# Patient Record
Sex: Female | Born: 1965 | Race: Black or African American | Hispanic: No | Smoking: Former smoker
Health system: Southern US, Community
[De-identification: ages and names within clinical notes are randomized; demographics above are authoritative.]

## PROBLEM LIST (undated history)

## (undated) HISTORY — PX: TUBAL LIGATION: SHX77

---

## 2015-09-03 ENCOUNTER — Ambulatory Visit (INDEPENDENT_AMBULATORY_CARE_PROVIDER_SITE_OTHER): Payer: BLUE CROSS/BLUE SHIELD

## 2015-09-03 ENCOUNTER — Ambulatory Visit (INDEPENDENT_AMBULATORY_CARE_PROVIDER_SITE_OTHER): Payer: BLUE CROSS/BLUE SHIELD | Admitting: Family Medicine

## 2015-09-03 VITALS — BP 120/72 | HR 83 | Temp 98.1°F | Resp 18 | Ht 62.0 in | Wt 181.4 lb

## 2015-09-03 DIAGNOSIS — R202 Paresthesia of skin: Secondary | ICD-10-CM | POA: Diagnosis not present

## 2015-09-03 DIAGNOSIS — M654 Radial styloid tenosynovitis [de Quervain]: Secondary | ICD-10-CM | POA: Diagnosis not present

## 2015-09-03 DIAGNOSIS — M25531 Pain in right wrist: Secondary | ICD-10-CM

## 2015-09-03 DIAGNOSIS — Z8669 Personal history of other diseases of the nervous system and sense organs: Secondary | ICD-10-CM

## 2015-09-03 MED ORDER — HYDROCODONE-ACETAMINOPHEN 5-325 MG PO TABS: 1.0000 | ORAL_TABLET | Freq: Four times a day (QID) | ORAL | Status: DC | PRN

## 2015-09-03 MED ORDER — METHYLPREDNISOLONE 4 MG PO TBPK: ORAL_TABLET | ORAL | Status: DC

## 2015-09-03 MED ORDER — NAPROXEN 500 MG PO TABS: 500.0000 mg | ORAL_TABLET | Freq: Two times a day (BID) | ORAL | Status: DC

## 2015-09-03 MED ORDER — CYCLOBENZAPRINE HCL 5 MG PO TABS: 5.0000 mg | ORAL_TABLET | Freq: Three times a day (TID) | ORAL | Status: DC | PRN

## 2015-09-03 NOTE — Patient Instructions (Signed)
De Quervain Tenosynovitis °Tendons attach muscles to bones. They also help with joint movements. When tendons become irritated or swollen, it is called tendinitis. °The extensor pollicis brevis (EPB) tendon connects the EPB muscle to a bone that is near the base of the thumb. The EPB muscle helps to straighten and extend the thumb. De Quervain tenosynovitis is a condition in which the EPB tendon lining (sheath) becomes irritated, thickened, and swollen. This condition is sometimes called stenosing tenosynovitis. This condition causes pain on the thumb side of the back of the wrist. °CAUSES °Causes of this condition include: °· Activities that repeatedly cause your thumb and wrist to extend. °· A sudden increase in activity or change in activity that affects your wrist. °RISK FACTORS: °This condition is more likely to develop in: °· Females. °· People who have diabetes. °· Women who have recently given birth. °· People who are over 40 years of age. °· People who do activities that involve repeated hand and wrist motions, such as tennis, racquetball, volleyball, gardening, and taking care of children. °· People who do heavy labor. °· People who have poor wrist strength and flexibility. °· People who do not warm up properly before activities. °SYMPTOMS °Symptoms of this condition include: °· Pain or tenderness over the thumb side of the back of the wrist when your thumb and wrist are not moving. °· Pain that gets worse when you straighten your thumb or extend your thumb or wrist. °· Pain when the injured area is touched. °· Locking or catching of the thumb joint while you bend and straighten your thumb. °· Decreased thumb motion due to pain. °· Swelling over the affected area. °DIAGNOSIS °This condition is diagnosed with a medical history and physical exam. Your health care provider will ask for details about your injury and ask about your symptoms. °TREATMENT °Treatment may include the use of icing and medicines to  reduce pain and swelling. You may also be advised to wear a splint or brace to limit your thumb and wrist motion. In less severe cases, treatment may also include working with a physical therapist to strengthen your wrist and calm the irritation around your EPB tendon sheath. In severe cases, surgery may be needed. °HOME CARE INSTRUCTIONS °If You Have a Splint or Brace: °· Wear it as told by your health care provider. Remove it only as told by your health care provider. °· Loosen the splint or brace if your fingers become numb and tingle, or if they turn cold and blue. °· Keep the splint or brace clean and dry. °Managing Pain, Stiffness, and Swelling  °· If directed, apply ice to the injured area. °¨ Put ice in a plastic bag. °¨ Place a towel between your skin and the bag. °¨ Leave the ice on for 20 minutes, 2-3 times per day. °· Move your fingers often to avoid stiffness and to lessen swelling. °· Raise (elevate) the injured area above the level of your heart while you are sitting or lying down. °General Instructions °· Return to your normal activities as told by your health care provider. Ask your health care provider what activities are safe for you. °· Take over-the-counter and prescription medicines only as told by your health care provider. °· Keep all follow-up visits as told by your health care provider. This is important. °· Do not drive or operate heavy machinery while taking prescription pain medicine. °SEEK MEDICAL CARE IF: °· Your pain, tenderness, or swelling gets worse, even if you have had   treatment.  You have numbness or tingling in your wrist, hand, or fingers on the injured side.   This information is not intended to replace advice given to you by your health care provider. Make sure you discuss any questions you have with your health care provider.   Document Released: 08/14/2005 Document Revised: 05/05/2015 Document Reviewed: 10/20/2014 Elsevier Interactive Patient Education 2016  Elsevier Inc. Carpal Tunnel Syndrome Carpal tunnel syndrome is a condition that causes pain in your hand and arm. The carpal tunnel is a narrow area that is on the palm side of your wrist. Repeated wrist motion or certain diseases may cause swelling in the tunnel. This swelling can pinch the main nerve in the wrist (median nerve).  HOME CARE If You Have a Splint:  Wear it as told by your doctor. Remove it only as told by your doctor.  Loosen the splint if your fingers:  Become numb and tingle.  Turn blue and cold.  Keep the splint clean and dry. General Instructions  Take over-the-counter and prescription medicines only as told by your doctor.  Rest your wrist from any activity that may be causing your pain. If needed, talk to your employer about changes that can be made in your work, such as getting a wrist pad to use while typing.  If directed, apply ice to the painful area:  Put ice in a plastic bag.  Place a towel between your skin and the bag.  Leave the ice on for 20 minutes, 2-3 times per day.  Keep all follow-up visits as told by your doctor. This is important.  Do any exercises as told by your doctor, physical therapist, or occupational therapist. GET HELP IF:  You have new symptoms.  Medicine does not help your pain.  Your symptoms get worse.   This information is not intended to replace advice given to you by your health care provider. Make sure you discuss any questions you have with your health care provider.   Document Released: 08/03/2011 Document Revised: 05/05/2015 Document Reviewed: 12/30/2014 Elsevier Interactive Patient Education [employer]2016 Elsevier Inc.
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
## 2015-09-08 DIAGNOSIS — Z8669 Personal history of other diseases of the nervous system and sense organs: Secondary | ICD-10-CM | POA: Insufficient documentation

## 2017-08-31 IMAGING — CR DG WRIST COMPLETE 3+V*R*
4 series · 4 of 4 positions shown · non-contrast
Comparison: None.

CLINICAL DATA: Radial side wrist pain over the last 2 weeks.

EXAM:
RIGHT WRIST - COMPLETE 3+ VIEW

[PA]
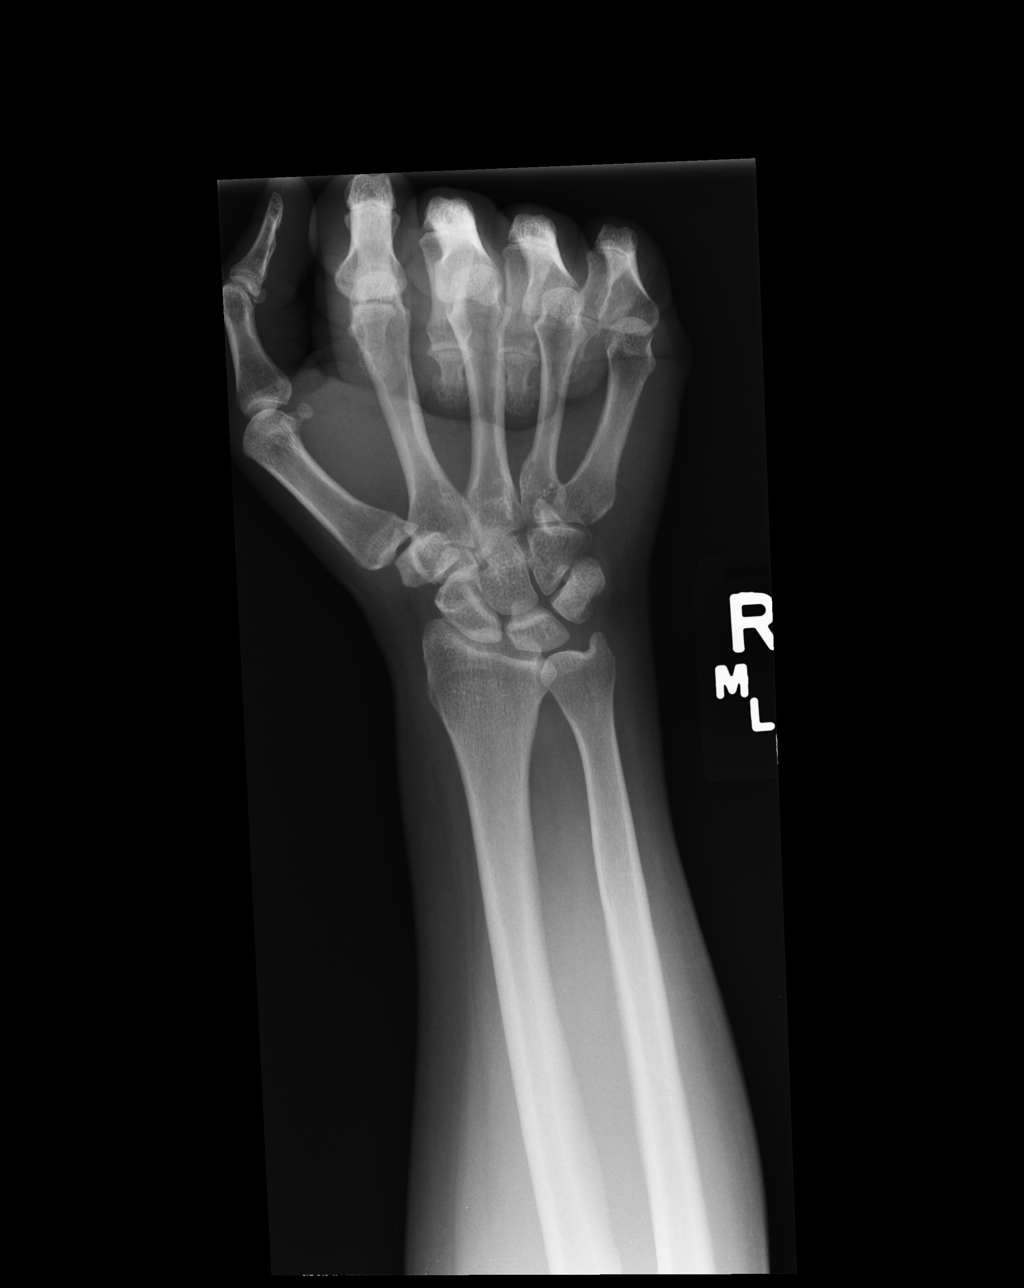

[pa int rot]
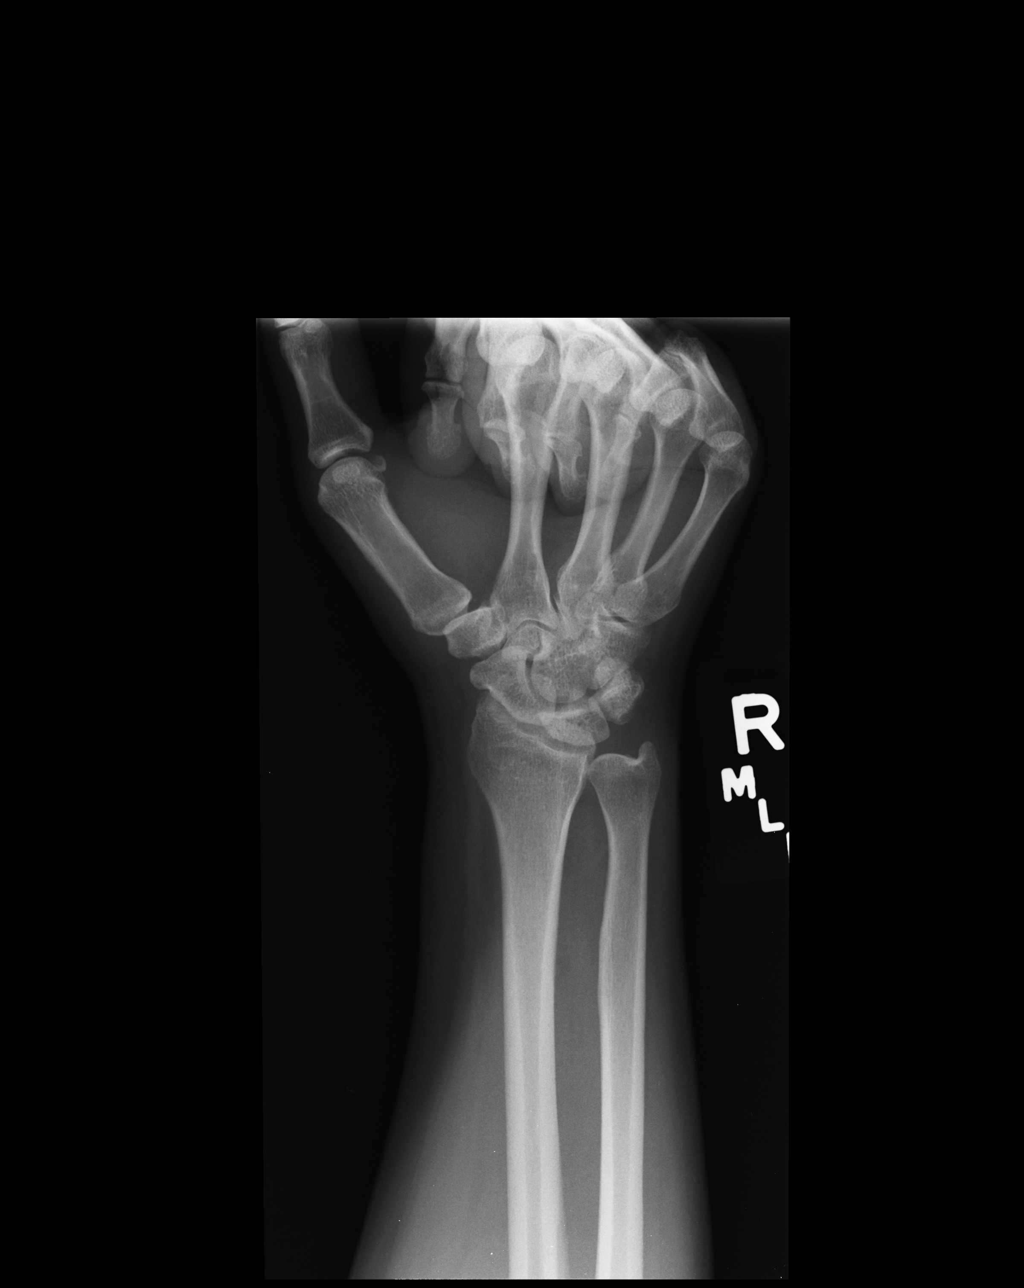

[lateral]
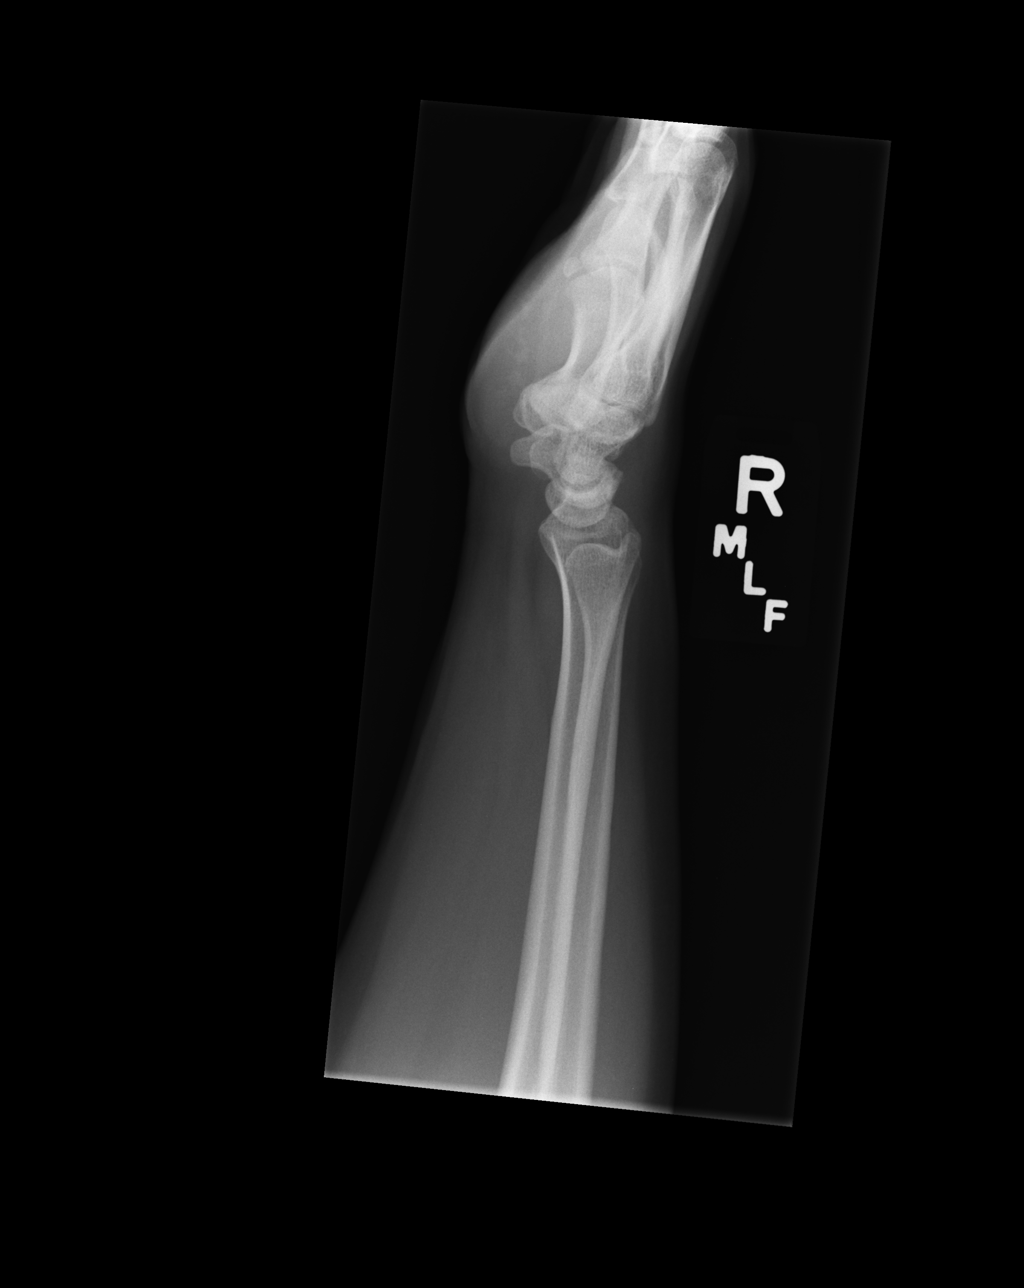

[pa navicular]
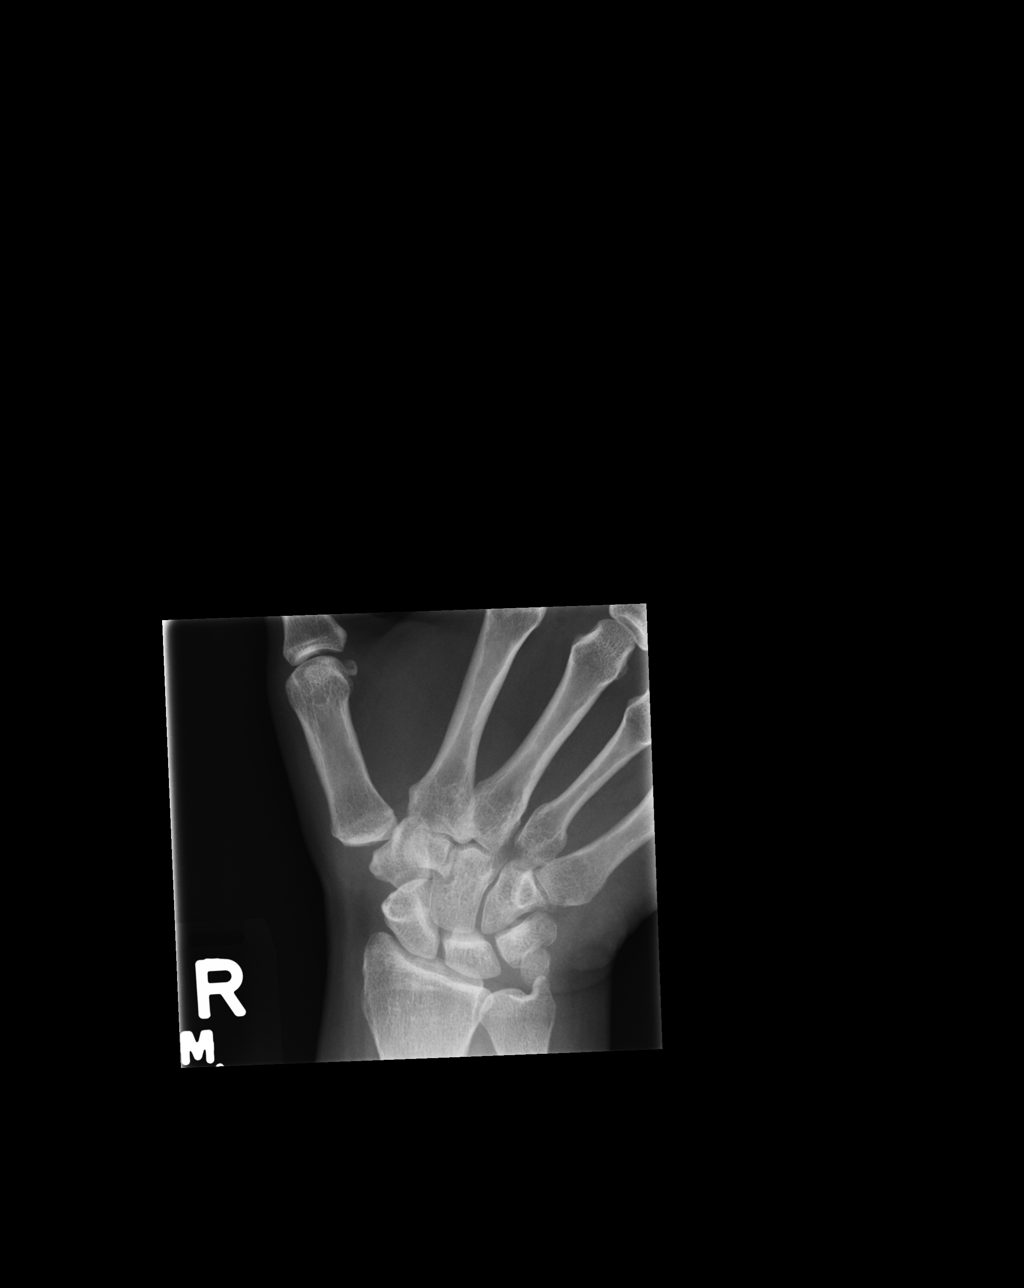

[4 of 4 positions shown; findings below may reference images not displayed]

FINDINGS: There is no evidence of fracture or dislocation. There is no
evidence of arthropathy or other focal bone abnormality. Soft
tissues are unremarkable.
IMPRESSION: Normal radiographs

## 2017-09-18 ENCOUNTER — Encounter: Payer: Self-pay | Admitting: Medical

## 2017-09-18 ENCOUNTER — Ambulatory Visit (INDEPENDENT_AMBULATORY_CARE_PROVIDER_SITE_OTHER): Payer: 59 | Admitting: Medical

## 2017-09-18 VITALS — BP 124/68 | HR 78 | Temp 98.6°F | Ht 61.5 in | Wt 175.6 lb

## 2017-09-18 DIAGNOSIS — J988 Other specified respiratory disorders: Secondary | ICD-10-CM | POA: Diagnosis not present

## 2017-09-18 DIAGNOSIS — H1033 Unspecified acute conjunctivitis, bilateral: Secondary | ICD-10-CM

## 2017-09-18 DIAGNOSIS — R05 Cough: Secondary | ICD-10-CM

## 2017-09-18 DIAGNOSIS — R059 Cough, unspecified: Secondary | ICD-10-CM

## 2017-09-18 MED ORDER — AZITHROMYCIN 500 MG PO TABS
ORAL_TABLET | ORAL | 0 refills | Status: DC
Start: 2017-09-18 — End: 2017-12-24

## 2017-09-18 MED ORDER — HYDROCODONE-HOMATROPINE 5-1.5 MG/5ML PO SYRP: 5.0000 mL | ORAL_SOLUTION | Freq: Three times a day (TID) | ORAL | 0 refills | Status: DC | PRN

## 2017-09-18 MED ORDER — POLYMYXIN B-TRIMETHOPRIM 10000-0.1 UNIT/ML-% OP SOLN: 2.0000 [drp] | Freq: Four times a day (QID) | OPHTHALMIC | 0 refills | Status: DC

## 2017-09-18 NOTE — Progress Notes (Signed)
Subjective:  Nazariah Schranz is a 52 y.o. female who presents as a new patient for cough, illness. Chief Complaint  Patient presents with  . New Patient (Initial Visit)    new pt, sore throat, headache, eye redness, dry coughing    She has been here in [place] for 3 years but no recent primary care anywhere.  She reports a one-week history of cough, somewhat productive cough, sore throat, headache, lingering cold that will not resolve.  However in the last 2 days has had red eyes, goopy eyes in the morning, crusting and worried about pinkeye.  Denies fever, nausea vomiting or diarrhea, no wheezing no shortness of breath no ear pain.  She is using over-the-counter cough medication.  She is leaving for a trip to the [place] in about a week, cannot afford to be sick as she has been planning this trip for 2 years.  No other aggravating or relieving factors.  No other c/o.  No past medical history on file.  ROS as in subjective   Objective: BP 124/68   Pulse 78   Temp 98.6 F (37 C)   Ht 5' 1.5" (1.562 m)   Wt 175 lb 9.6 oz (79.7 kg)   SpO2 99%   BMI 32.64 kg/m   General appearance: Alert, WD/WN, no distress                             Skin: warm, no rash                           Head: +frontal sinus tenderness,                            Eyes: conjunctiva normal, corneas clear, PERRLA                            Ears: pearly TMs, external ear canals normal                          Nose: septum midline, turbinates swollen, with erythema and mucoid discharge             Mouth/throat: MMM, tongue normal, mild pharyngeal erythema                           Neck: supple, no adenopathy, no thyromegaly, non tender                          Heart: RRR, normal S1, S2, no murmurs                         Lungs: CTA bilaterally, no wheezes, rales, or rhonchi       Assessment  Encounter Diagnoses  Name Primary?  . Acute conjunctivitis of both eyes, unspecified acute conjunctivitis type  Yes  . Cough   . Respiratory tract infection       Plan: We discussed her symptoms and exam findings.  Begin medication as below, rest, hydrate well, call or return if not improving or worse within the next 3-4 days.  Discussed diagnosis of conjunctivitis/pink eye.  Advised that pink eye is very contagious and spreads by direct contact.  Discussed treatment including moist  compresses, avoid rubbing eyes, do not wear contact lenses or makeup until infection is resolved.  Discussed prevention, hand washing, not rubbing eyes.    Gursimran was seen today for new patient (initial visit).  Diagnoses and all orders for this visit:  Acute conjunctivitis of both eyes, unspecified acute conjunctivitis type  Cough  Respiratory tract infection  Other orders -     HYDROcodone-homatropine (HYCODAN) 5-1.5 MG/5ML syrup; Take 5 mLs by mouth every 8 (eight) hours as needed for cough. -     azithromycin (ZITHROMAX) 500 MG tablet; 1 tablet po daily x 3 days -     trimethoprim-polymyxin b (POLYTRIM) ophthalmic solution; Place 2 drops into both eyes every 6 (six) hours.    Patient was advised to call or return if worse or not improving in the next few days.    Patient voiced understanding of diagnosis, recommendations, and treatment plan.

## 2017-12-24 ENCOUNTER — Encounter: Payer: Self-pay | Admitting: Medical

## 2017-12-24 ENCOUNTER — Ambulatory Visit (INDEPENDENT_AMBULATORY_CARE_PROVIDER_SITE_OTHER): Payer: 59 | Admitting: Medical

## 2017-12-24 VITALS — BP 128/66 | HR 85 | Temp 98.2°F | Ht 61.5 in | Wt 186.4 lb

## 2017-12-24 DIAGNOSIS — Z6834 Body mass index (BMI) 34.0-34.9, adult: Secondary | ICD-10-CM | POA: Insufficient documentation

## 2017-12-24 DIAGNOSIS — Z87891 Personal history of nicotine dependence: Secondary | ICD-10-CM | POA: Diagnosis not present

## 2017-12-24 DIAGNOSIS — R9431 Abnormal electrocardiogram [ECG] [EKG]: Secondary | ICD-10-CM | POA: Diagnosis not present

## 2017-12-24 DIAGNOSIS — Z136 Encounter for screening for cardiovascular disorders: Secondary | ICD-10-CM | POA: Insufficient documentation

## 2017-12-24 DIAGNOSIS — R635 Abnormal weight gain: Secondary | ICD-10-CM | POA: Diagnosis not present

## 2017-12-24 DIAGNOSIS — Z862 Personal history of diseases of the blood and blood-forming organs and certain disorders involving the immune mechanism: Secondary | ICD-10-CM

## 2017-12-24 NOTE — Progress Notes (Addendum)
Subjective: Chief Complaint  Patient presents with  . Referral    weight management    Here for weight concerns.  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  Seeing a nutritionist at her job for the past year.  Doing a 52 week program, has juicer, does online log in every week.   Had lost some weight with this.   Works at [employer].  No prior weight program.  Use to be a runner.   Is at a sit down job.   Has access to gym free at work.   Uses all natural fruit and kale smoothies.  Doesn't drink soda.  Exercise - a lot of walking on the treadmill.  No hx/o diabetes, HTN.   No other prior organized plan until this past year.  No prior medication for weight loss.  No prior surgery consult, not interested in surgery.  No prior concern for sleep apnea, had test for this in the past, maybe 2 years ago, was reportedly normal. Is requesting referral to [facility] weight loss clinic, Lenise Herald FNP for other strategies, possibly phentermine.  Does smoothie in the morning.   Has mid morning snack.  Eats a lot of fruit, sometimes crackers.  No fried food. States she is on 1200 cal restriction currently per nutritionist.     No past medical history on file.   Family History  Problem Relation Age of Onset  . Heart disease Father 68       CABG, valve disease, pacemaker    No current outpatient medications on file prior to visit.   No current facility-administered medications on file prior to visit.    ROS as in subjective    Objective: BP 128/66   Pulse 85   Temp 98.2 F (36.8 C)   Ht 5' 1.5" (1.562 m)   Wt 186 lb 6.4 oz (84.6 kg)   SpO2 99%   BMI 34.65 kg/m   Wt Readings from Last 3 Encounters:  12/24/17 186 lb 6.4 oz (84.6 kg)  09/18/17 175 lb 9.6 oz (79.7 kg)  09/03/15 181 lb 6.4 oz (82.3 kg)   General appearance: alert, no distress, WD/WN,  Neck: supple, no lymphadenopathy, no thyromegaly, no masses, no bruits Heart:  RRR, normal S1, S2, no murmurs Lungs: CTA bilaterally, no wheezes, rhonchi, or rales Pulses: 2+ symmetric, upper and lower extremities, normal cap refill Ext: no edema Psych: pleasant, good eye contact, answers questions appropriately   Adult ECG Report  Indication: heart disease screen, weight loss efforts  Rate: 79 bpm  Rhythm: normal sinus rhythm  QRS Axis: -61 degrees  PR Interval: 180 ms  QRS Duration:  QTc:  Conduction Disturbances: T wave inversions lateral leads, prolonged QT, incomplete RBBB  Other Abnormalities: none  Patient's cardiac risk factors are: obesity (BMI >= 30 kg/m2).  EKG comparison: none  Narrative Interpretation: abnormal EKG    Assessment: Encounter Diagnoses  Name Primary?  . Weight gain Yes  . BMI 34.0-34.9,adult   . History of anemia   . Screening for heart disease   . Abnormal EKG   . Former smoker      Plan: Discussed her concerns, her efforts up to this point with weight loss.     She will continue seeing nutritionist regularly, she is continuing the 52 week weight loss monitoring and exercise program.   She will get me a copy of her biometric lipid and  diabetes lab she just had done recently.   In her initial eval today, EKG was abnormal.  Father has hx/o heart disease in older age.  She has long history of tobacco use intermittently for 30+ years, but not consistent.  She is a former smoker.  We will refer to cardiology for baseline eval before other recommendations.    Jasmine Lloyd was seen today for referral.  Diagnoses and all orders for this visit:  Weight gain -     EKG 12-Lead -     TSH -     VITAMIN D 25 Hydroxy (Vit-D Deficiency, Fractures) -     CBC -     Vitamin B12 -     Basic metabolic panel  BMI 34.0-34.9,adult -     EKG 12-Lead -     TSH -     VITAMIN D 25 Hydroxy (Vit-D Deficiency, Fractures) -     CBC -     Vitamin B12 -     Basic metabolic panel  History of anemia -     CBC -     Basic metabolic  panel  Screening for heart disease -     Basic metabolic panel  Abnormal EKG -     Ambulatory referral to Cardiology  Former smoker

## 2017-12-25 ENCOUNTER — Other Ambulatory Visit: Payer: Self-pay | Admitting: Medical

## 2017-12-25 LAB — CBC
HEMOGLOBIN: 12.7 g/dL (ref 11.1–15.9)
Hematocrit: 38 % (ref 34.0–46.6)
MCH: 30.1 pg (ref 26.6–33.0)
MCHC: 33.4 g/dL (ref 31.5–35.7)
MCV: 90 fL (ref 79–97)
Platelets: 297 10*3/uL (ref 150–379)
RBC: 4.22 x10E6/uL (ref 3.77–5.28)
RDW: 14.4 % (ref 12.3–15.4)
WBC: 6.6 10*3/uL (ref 3.4–10.8)

## 2017-12-25 LAB — TSH: TSH: 1.03 u[IU]/mL (ref 0.450–4.500)

## 2017-12-25 LAB — VITAMIN D 25 HYDROXY (VIT D DEFICIENCY, FRACTURES): Vit D, 25-Hydroxy: 13.1 ng/mL — ABNORMAL LOW (ref 30.0–100.0)

## 2017-12-25 LAB — VITAMIN B12: Vitamin B-12: >2000 pg/mL — ABNORMAL HIGH (ref 232–1245)

## 2017-12-25 MED ORDER — VITAMIN D (ERGOCALCIFEROL) 1.25 MG (50000 UNIT) PO CAPS: 50000.0000 [IU] | ORAL_CAPSULE | ORAL | 0 refills | Status: AC

## 2017-12-25 NOTE — Progress Notes (Signed)
Pt was informed of her lab results.

## 2017-12-25 NOTE — Progress Notes (Signed)
lmom asking patient to call back for lab results.

## 2017-12-26 LAB — BASIC METABOLIC PANEL
BUN / CREAT RATIO: 16 (ref 9–23)
BUN: 9 mg/dL (ref 6–24)
CHLORIDE: 102 mmol/L (ref 96–106)
CO2: 18 mmol/L — AB (ref 20–29)
CREATININE: 0.56 mg/dL — AB (ref 0.57–1.00)
Calcium: 9.3 mg/dL (ref 8.7–10.2)
GFR calc Af Amer: 125 mL/min/{1.73_m2} (ref >59)
GFR calc non Af Amer: 108 mL/min/{1.73_m2} (ref >59)
GLUCOSE: 86 mg/dL (ref 65–99)
Potassium: 4.1 mmol/L (ref 3.5–5.2)
SODIUM: 139 mmol/L (ref 134–144)

## 2017-12-26 LAB — SPECIMEN STATUS REPORT

## 2022-04-13 ENCOUNTER — Ambulatory Visit (INDEPENDENT_AMBULATORY_CARE_PROVIDER_SITE_OTHER): Payer: No Typology Code available for payment source | Admitting: Behavioral Health

## 2022-04-13 ENCOUNTER — Encounter: Payer: Self-pay | Admitting: Behavioral Health

## 2022-04-13 DIAGNOSIS — F4321 Adjustment disorder with depressed mood: Secondary | ICD-10-CM | POA: Diagnosis not present
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
## 2022-04-13 NOTE — Progress Notes (Deleted)
                Jyaire Koudelka L Tamiko Leopard, LMFT 

## 2022-04-20 ENCOUNTER — Ambulatory Visit (INDEPENDENT_AMBULATORY_CARE_PROVIDER_SITE_OTHER): Payer: No Typology Code available for payment source | Admitting: Behavioral Health

## 2022-04-20 DIAGNOSIS — F4321 Adjustment disorder with depressed mood: Secondary | ICD-10-CM | POA: Diagnosis not present

## 2022-04-20 NOTE — Progress Notes (Signed)
[facility] Counselor/Therapist Progress Note  Patient ID: Jasmine Lloyd, MRN: [id number],    Date: 04/20/2022  Time Spent: 45 min on Caregility; Pt @ home in private/Clinician working remote from home   Treatment Type: Individual Therapy  Reported Symptoms: Pt is excited about possibility of a lateral move @ work soon to new position w/lower stress  Mental Status Exam: Appearance:  NA     Behavior: Appropriate  Motor: NA  Speech/Language:  Normal Rate  Affect: Appropriate  Mood: anxious and depressed  Thought process: normal  Thought content:   WNL  Sensory/Perceptual disturbances:   WNL  Orientation: oriented to person, place, and time/date  Attention: Good  Concentration: Good  Memory: WNL  Fund of knowledge:  Good  Insight:   Good  Judgment:  Good  Impulse Control: Good   Risk Assessment: Danger to Self:  No Self-injurious Behavior: No Danger to Others: No Duty to Warn:no Physical Aggression / Violence:No  Access to Firearms a concern: No  Gang Involvement:No   Subjective: Pt is encouraged that work may soon change & lower her stress levels. She has maxed out her PTO benefits & recognizes her need for time off from work. She is frustrated w/a lack of structure @ work & how much her work has changed in the past year. Expectations of her workload have changed & she is multi-tasking at greater levels daily.  Interventions: Solution-Oriented/Positive Psychology; s-f psychotherapy  Diagnosis:Adjustment reaction with prolonged depressive reaction  Plan: Cont your notebook recordings, cont w/Self-Care practice efforts, & reach out for your resources.   Deneise Lever, LMFT

## 2022-04-20 NOTE — Progress Notes (Signed)
                Jowanda Heeg L Kamirah Shugrue, LMFT 

## 2022-04-27 ENCOUNTER — Ambulatory Visit (INDEPENDENT_AMBULATORY_CARE_PROVIDER_SITE_OTHER): Payer: No Typology Code available for payment source | Admitting: Behavioral Health

## 2022-04-27 DIAGNOSIS — F4321 Adjustment disorder with depressed mood: Secondary | ICD-10-CM | POA: Diagnosis not present

## 2022-04-27 NOTE — Progress Notes (Addendum)
[facility] Counselor/Therapist Progress Note  Patient ID: Jasmine Lloyd, MRN: [id number],    Date: 04/27/2022  Time Spent: 50 min Caregility video with Pt @ home in private; Provider is remote from [facility] Office on Caregility  Treatment Type: Individual Therapy  Reported Symptoms: Pt is stressed due to work & she cont's to take time off due to this.   Mental Status Exam: Appearance:  NA     Behavior: Appropriate  Motor: NA  Speech/Language:  Clear and Coherent  Affect: Appropriate  Mood: normal  Thought process: normal  Thought content:   WNL  Sensory/Perceptual disturbances:   WNL  Orientation: oriented to person, place, and time/date  Attention: Good  Concentration: Good  Memory: WNL  Fund of knowledge:  Good  Insight:   Good  Judgment:  Good  Impulse Control: Good   Risk Assessment: Danger to Self:  No Self-injurious Behavior: No Danger to Others: No Duty to Warn:no Physical Aggression / Violence:No  Access to Firearms a concern: No  Gang Involvement:No   Subjective: Pt is exp'g a stressful work week that has taken a toll on her mental health. She is in a job that requires politeness & professionalism that is frustrating to maintain. Pt sts, "I need time off to collect myself."  This week she has encountered several individuals that were hateful & rude & show little respect which has escalated her need for time off work.  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  Interventions:  Narrative Th & s-f Th  Diagnosis:Adjustment reaction with prolonged depressive reaction  Plan: ST: Amelya is feeling burnt out @ work. Pt needs to process her state of being & readiness for time off work. Cont to keep your Notebook btwn sessions. Provide new observations in next session from your Notebook on Mon, Sept 25th, 2023.  Target Date: 05/22/2022  Progress: 8  Frequency: Twice monthly  Modality: Rudean Hitt  LT: Ashiya sts she is uncertain about her job & what to do to  stay healthy in her current role w/so many changes happening. Provide Pt w/skills for coping & eventual gradual RTW or other alternatives. Work on Engineer, maintenance (IT) & inc these skills in 2 mos by Jun 22, 2022.  Target Date: 06/22/2022  Progress: 7  Frequency: Twice monthly  Modality: Claretta Fraise, LMFT

## 2022-04-27 NOTE — Progress Notes (Deleted)
                Jasmine Lloyd L Jasmine Geraci, LMFT 

## 2022-05-03 ENCOUNTER — Encounter: Payer: Self-pay | Admitting: Internal Medicine

## 2022-05-08 ENCOUNTER — Ambulatory Visit (INDEPENDENT_AMBULATORY_CARE_PROVIDER_SITE_OTHER): Payer: No Typology Code available for payment source | Admitting: Behavioral Health

## 2022-05-08 DIAGNOSIS — F4321 Adjustment disorder with depressed mood: Secondary | ICD-10-CM | POA: Diagnosis not present

## 2022-05-08 NOTE — Progress Notes (Signed)
                Ceci Taliaferro L Kadon Andrus, LMFT 

## 2022-05-08 NOTE — Progress Notes (Signed)
[facility] Counselor/Therapist Progress Note  Patient ID: Surah Pelley, MRN: [id number],    Date: 05/08/2022  Time Spent: 51 min In Person @ [facility] Office   Treatment Type: Individual Therapy  Reported Symptoms: Pt has improved anx/dep & work distress. She has met a new man in the last week or so & she is enjoying the new relationship while also being careful.   Mental Status Exam: Appearance:  Well Groomed     Behavior: Appropriate  Motor: Normal  Speech/Language:  Normal Rate  Affect: Appropriate  Mood: normal  Thought process: normal  Thought content:   WNL  Sensory/Perceptual disturbances:   WNL  Orientation: oriented to person, place, and time/date  Attention: Good  Concentration: Good  Memory: WNL  Fund of knowledge:  Good  Insight:   Good  Judgment:  Good  Impulse Control: Good   Risk Assessment: Danger to Self:  No Self-injurious Behavior: No Danger to Others: No Duty to Warn:no Physical Aggression / Violence:No  Access to Firearms a concern: No  Gang Involvement:No   Subjective: Pt has tried to get her work attitude back on the right track. She cont's to be given more & more duties which causes her stress, but she is trying to deal w/her Supervisior about it in a professional way.  Pt has provided her female Roommate w/notice she needs to move out into her own place.    Interventions: Solution-Oriented/Positive Psychology  Diagnosis:Adjustment reaction with prolonged depressive reaction  Plan: ST: Pt will cont to use coping skills for work so she can improve her ability to be productive.   Pt will keep in mind the relational caveats discussed today as she moves towards a friendship w/a new Sig Other, possibly one she can be happier with in the long run.   Donnetta Hutching, LMFT

## 2022-05-22 ENCOUNTER — Ambulatory Visit: Payer: No Typology Code available for payment source | Admitting: Behavioral Health

## 2022-05-22 DIAGNOSIS — F4321 Adjustment disorder with depressed mood: Secondary | ICD-10-CM

## 2022-05-22 NOTE — Progress Notes (Signed)
                Avie Checo L Kally Cadden, LMFT 

## 2022-05-22 NOTE — Progress Notes (Signed)
[facility] Counselor/Therapist Progress Note  Patient ID: Jasmine Lloyd, MRN: [id number],    Date: 05/22/2022  Time Spent: 57 min In Person @ [facility] Office   Treatment Type: Individual Therapy  Reported Symptoms: Pt is trying to handle the emot'l fallout from a breakup w/her Sig Other. Pt is @ ease w/her decision to finalize the breakup, but female Partner cont's to pick arguments & cause drama as she gets more independent from Pt.   Mental Status Exam: Appearance:  Casual     Behavior: Appropriate and Sharing  Motor: Normal  Speech/Language:  Normal Rate  Affect: Appropriate  Mood: normal  Thought process: normal  Thought content:   WNL  Sensory/Perceptual disturbances:   WNL  Orientation: oriented to person, place, and time/date  Attention: Good  Concentration: Good  Memory: WNL  Fund of knowledge:  Good  Insight:   Good  Judgment:  Good  Impulse Control: Good   Risk Assessment: Danger to Self:  No Self-injurious Behavior: No Danger to Others: No Duty to Warn:no Physical Aggression / Violence:No  Access to Firearms a concern: No  Gang Involvement:No   Subjective: Pt has been off work today & getting things done for herself. She has noticed the beh of her female live-in Partner as they split ways & the woman has been fairly dramatic in her response to the break-up. Pt related how much she helped this woman after knee surgery over the wknd & her expectations are out of proportion to the state of the relationship.  Pt has a giving spirit & this female has taken advantage of it in the past & cont's to try to do it currently. Pt is having to set her boundaries & be strong about keeping them.   Interventions: Narrative  Diagnosis:Adjustment reaction with prolonged depressive reaction  Plan: ST goals: Pt will keep notes on her progress w/the break-up using the psychoedu & info provided to have a healthier adjustment to the situation. We will review this  next session on Tues, Oct 17th @ 9:00am.  LT goals: Pt will learn the skills invld in beginning new relationships so this time can be healthier for her. Pt will implement the coping skills discussed in the next 2-3 mos during the "honeymoon phase" of her new female interest. Pt will show good progress by 3 mos from today during the week of Dec 18th, 2023 as evidenced by her healthy beh in the new relationship as she compares it to her optimum functioning.   Donnetta Hutching, LMFT

## 2022-06-06 ENCOUNTER — Encounter: Payer: Self-pay | Admitting: Internal Medicine

## 2022-06-13 ENCOUNTER — Ambulatory Visit: Payer: No Typology Code available for payment source | Admitting: Behavioral Health

## 2022-06-13 DIAGNOSIS — F4321 Adjustment disorder with depressed mood: Secondary | ICD-10-CM | POA: Diagnosis not present

## 2022-06-13 NOTE — Progress Notes (Signed)
                Jasmine Lloyd L [name], LMFT 
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
## 2022-07-05 ENCOUNTER — Ambulatory Visit: Payer: No Typology Code available for payment source | Admitting: Behavioral Health

## 2022-07-05 DIAGNOSIS — F4321 Adjustment disorder with depressed mood: Secondary | ICD-10-CM

## 2022-07-06 NOTE — Progress Notes (Signed)
[facility] Counselor/Therapist Progress Note  Patient ID: Jasmine Lloyd, MRN: [id number],    Date: 07/06/2022  Time Spent: 55 min In Person @ [facility] Office   Treatment Type: Individual Therapy  Reported Symptoms: Elevated anx & stress due to her former GF still living in the same Apt w/her although they have broken up.   Mental Status Exam: Appearance:  Casual     Behavior: Appropriate and Sharing  Motor: Normal  Speech/Language:  Clear and Coherent  Affect: Appropriate  Mood: normal  Thought process: normal  Thought content:   WNL  Sensory/Perceptual disturbances:   WNL  Orientation: oriented to person, place, and time/date  Attention: Good  Concentration: Good  Memory: WNL  Fund of knowledge:  Good  Insight:   Good  Judgment:  Good  Impulse Control: Good   Risk Assessment: Danger to Self:  No Self-injurious Behavior: No Danger to Others: No Duty to Warn:no Physical Aggression / Violence:No  Access to Firearms a concern: No  Gang Involvement:No   Subjective: Pt is doing well @ work & will start her new position next month. She is still having inc'd frustration over her GF's beh & moodiness.   Interventions: Solution-Oriented/Positive Psychology  Diagnosis:Adjustment reaction with prolonged depressive reaction  Plan: Ardys reports her GF is annoying, frustrating & moody. Provided Pt w/psychoedu to further understand her GF's moods & connect this to herself. Pt will begin to consider how she can do things differently & convince her GF to move into her own place as this situation is not working any longer.  Target Date: 08/04/2022  Progress: 0  Frequency: Twice monthly  Modality: Claretta Fraise, LMFT

## 2022-07-06 NOTE — Progress Notes (Signed)
                Nyjae Hodge L Edrei Norgaard, LMFT 

## 2022-07-26 ENCOUNTER — Ambulatory Visit (INDEPENDENT_AMBULATORY_CARE_PROVIDER_SITE_OTHER): Payer: No Typology Code available for payment source | Admitting: Behavioral Health

## 2022-07-26 DIAGNOSIS — F4321 Adjustment disorder with depressed mood: Secondary | ICD-10-CM | POA: Diagnosis not present
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
## 2022-07-26 NOTE — Progress Notes (Signed)
                Jasmine Lloyd L Seham Gardenhire, LMFT 

## 2023-01-16 ENCOUNTER — Ambulatory Visit (INDEPENDENT_AMBULATORY_CARE_PROVIDER_SITE_OTHER): Payer: No Typology Code available for payment source | Admitting: Behavioral Health

## 2023-01-16 DIAGNOSIS — F411 Generalized anxiety disorder: Secondary | ICD-10-CM | POA: Diagnosis not present

## 2023-01-16 NOTE — Progress Notes (Signed)
[name] Progress Note  Patient ID: Jasmine Lloyd, MRN: [id number],    Date: 01/16/2023  Time Spent: 30 min Caregility video; Pt is home in private & Provider is working remotely from [facility]   Treatment Type: Individual Therapy  Reported Symptoms: Panic w/SOB, rise in HR, heightened sense of panic, hyperventilation & stress  Mental Status Exam: Appearance:  Casual     Behavior: Appropriate and Sharing  Motor: Normal  Speech/Language:  Clear and Coherent  Affect: Appropriate  Mood: normal  Thought process: normal  Thought content:   WNL  Sensory/Perceptual disturbances:   WNL  Orientation: oriented to person, place, and time/date  Attention: Good  Concentration: Good  Memory: WNL  Fund of knowledge:  Good  Insight:   Good  Judgment:  Good  Impulse Control: Good   Risk Assessment: Danger to Self:  No Self-injurious Behavior: No Danger to Others: No Duty to Warn:no Physical Aggression / Violence:No  Access to Firearms a concern: No  Gang Involvement:No   Subjective: Pt is anxious today bc her worries have lead to panic attacks. She is trying to lose weight & followed by Jasmine Lloyd. She has requested Jasmine Lloyd to do psychotherapy & see if it can help her deal w/the stress.   Interventions: Solution-Oriented/Positive Psychology  Diagnosis:Generalized anxiety disorder  Plan: Jasmine Lloyd c/o stress, anxiety & panic attacks. She is learning to relax through breathing efforts, listening to music & going on vacation soon for a [place] Trip 02/24/2023 - 02/27/2023. She will cont to dedicate herself to self-care efforts & try addt'l suggestions offered today.   Target Date: 02/25/2023  Progress: 4  Frequency: Once every 3-4 wks  Modality: Claretta Fraise, LMFT

## 2023-01-16 NOTE — Progress Notes (Signed)
                Clovia Reine L Encarnacion Scioneaux, LMFT 

## 2023-02-07 ENCOUNTER — Ambulatory Visit: Payer: No Typology Code available for payment source | Admitting: Behavioral Health

## 2023-02-07 DIAGNOSIS — F411 Generalized anxiety disorder: Secondary | ICD-10-CM

## 2023-02-07 NOTE — Progress Notes (Signed)
                Tahmir Kleckner L Winry Egnew, LMFT 
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
## 2023-03-06 ENCOUNTER — Ambulatory Visit: Payer: No Typology Code available for payment source | Admitting: Behavioral Health

## 2023-03-06 DIAGNOSIS — F4321 Adjustment disorder with depressed mood: Secondary | ICD-10-CM

## 2023-03-06 NOTE — Progress Notes (Signed)
                Chandler Stofer L Miles Borkowski, LMFT 

## 2023-03-06 NOTE — Progress Notes (Addendum)
[facility] Counselor/Therapist Progress Note  Patient ID: Jasmine Lloyd, MRN: [id number],    Date: 03/06/2023  Time Spent: 55 min Caregility video; Pt is home in private & Provider is working remote from [facility] [employer] [facility]. Pt is aware of the risks/limitations of telehealth & consents to Tx today on the [employer].   Time In: 2:00pm Time Out: 2:55pm  Treatment Type: Individual Therapy  Reported Symptoms: Reduction in anx/dep since medication has been managed by Irish Lack  Mental Status Exam: Appearance:  Casual     Behavior: Appropriate and Sharing  Motor: Normal  Speech/Language:  Clear and Coherent  Affect: Appropriate  Mood: Upbeat  Thought process: normal  Thought content:   WNL  Sensory/Perceptual disturbances:   WNL  Orientation: oriented to person, place, time/date, and situation  Attention: Good  Concentration: Good  Memory: WNL  Fund of knowledge:  Good  Insight:   Good  Judgment:  Good  Impulse Control: Good   Risk Assessment: Danger to Self:  No Self-injurious Behavior: No Danger to Others: No Duty to Warn:no Physical Aggression / Violence:No  Access to Firearms a concern: No  Gang Involvement:No   Subjective: Pt is c/o work situation that changes week to week. This is disarming to be moved btwn Depts constantly. She is dissatisfied w/her current work situation.   Her medication mgmt has improved & the Wellbutrin 150mg . This is providing balance to her days.    Interventions: Interpersonal  Diagnosis:Adjustment reaction with prolonged depressive reaction  Plan: Megan will do more research about her career choices. She wants to pursue her interests/passions. She will weigh the options more specifically & consider if Bankruptcy can salvage the situation or if the Benefits & pay are worth sacrificing. She is considering ST Disability.   Target Date: 04/12/2023  Progress: 5  Frequency: Once every 3-4 wks  Modality: Claretta Fraise, LMFT

## 2023-03-27 ENCOUNTER — Ambulatory Visit: Payer: No Typology Code available for payment source | Admitting: Behavioral Health

## 2023-03-27 NOTE — Progress Notes (Unsigned)
                Victoria L Winstead, LMFT 

## 2023-09-05 ENCOUNTER — Telehealth: Payer: No Typology Code available for payment source | Admitting: Medical

## 2023-09-05 VITALS — BP 126/88 | HR 110 | Temp 98.0°F | Wt 166.0 lb

## 2023-09-05 DIAGNOSIS — R058 Other specified cough: Secondary | ICD-10-CM | POA: Diagnosis not present

## 2023-09-05 DIAGNOSIS — J3489 Other specified disorders of nose and nasal sinuses: Secondary | ICD-10-CM

## 2023-09-05 DIAGNOSIS — J988 Other specified respiratory disorders: Secondary | ICD-10-CM | POA: Diagnosis not present

## 2023-09-05 MED ORDER — HYDROCODONE BIT-HOMATROP MBR 5-1.5 MG/5ML PO SOLN: 5.0000 mL | Freq: Three times a day (TID) | ORAL | 0 refills | Status: AC | PRN

## 2023-09-05 MED ORDER — AZITHROMYCIN 250 MG PO TABS: ORAL_TABLET | ORAL | 0 refills | Status: AC

## 2023-09-05 NOTE — Progress Notes (Signed)
 Subjective:     Patient ID: Jasmine Lloyd, female   DOB: [birth year], 58 y.o.   MRN: [id number]  This visit type was conducted due to national recommendations for restrictions regarding the COVID-19 Pandemic (e.g. social distancing) in an effort to limit this patient's exposure and mitigate transmission in our community.  Due to their co-morbid illnesses, this patient is at least at moderate risk for complications without adequate follow up.  This format is felt to be most appropriate for this patient at this time.    Documentation for virtual audio and video telecommunications through [facility] encounter:  The patient was located at home. The provider was located in the office. The patient did consent to this visit and is aware of possible charges through their insurance for this visit.  The other persons participating in this telemedicine service were none. Time spent on call was 20 minutes and in review of previous records 20 minutes total.  This virtual service is not related to other E/M service within previous 7 days.   HPI Chief Complaint  Patient presents with   sick    Sick symptoms- bad cough, sore throat, drainage, congestion, chest congestion. Coughing a lot of at night. Symptoms started 5 days ago. Taken otc cold and flu   Virtual consult for illness She notes 5 days of symptoms including starting with scratchy throat.  After a day or so started getting cough and the cough is gradual gotten worse with productive cough, colored phlegm, heavy chest congestion and head pressure, sinus pressure, cough keeping her up at night, lots of drainage down the back of her throat into the chest.  Using Cepacol lozenges for the throat, and was using Cepacol cough and congestion medicine but finished that whole box.  She ended up doing a virtual consult with another provider within the last 2 days.  They prescribe Tessalon Perles and a nasal spray.  She does not feel like that is helping at  all  She has missed a few days of work because of this as well  She is on the phone all day and the coughing is interfering with work  She drinks a lot of water every day.  Non-smoker.  No fever, no bodyaches or chills, no nausea vomiting or diarrhea.  No other aggravating or relieving factors. No other complaint.  No past medical history on file. Current Outpatient Medications on File Prior to Visit  Medication Sig Dispense Refill   benzonatate (TESSALON) 100 MG capsule Take 100 mg by mouth 3 (three) times daily.     buPROPion (WELLBUTRIN XL) 150 MG 24 hr tablet Take 300 mg by mouth every morning.     Cyanocobalamin 1000 MCG SUBL Place under the tongue.     FLUoxetine (PROZAC) 20 MG capsule Take 20 mg by mouth daily.     ipratropium (ATROVENT) 0.03 % nasal spray Place 2 sprays into both nostrils 3 (three) times daily.     QSYMIA 15-92 MG CP24 Take 1 capsule by mouth daily.     Vitamin D , Ergocalciferol , (DRISDOL ) 50000 units CAPS capsule Take 1 capsule (50,000 Units total) by mouth every 7 (seven) days. 12 capsule 0   No current facility-administered medications on file prior to visit.    Review of Systems As in subjective    Objective:   Physical Exam Due to coronavirus pandemic stay at home measures, patient visit was virtual and they were not examined in person.   BP 126/88   Pulse (!) 110  Temp 98 F (36.7 C)   Wt 166 lb (75.3 kg)   BMI 30.86 kg/m   General: Well-developed well-nourished no acute distress Somewhat congested sounding, cough periodically.  No labored breathing or wheezing     Assessment:     Encounter Diagnoses  Name Primary?   Sinus pressure Yes   Cough productive of purulent sputum    Respiratory tract infection        Plan:     Discussed symptoms, limitations of virtual consult  Advised continued rest, good hydration, can use Tessalon Perles during today for cough and can try the Hycodan syrup at night for worse cough.  Consider  Mucinex or Benadryl for mucus over the next few days.  Begin Z-Pak.  Discussed usual timeframe to see symptoms improve over the next 3 to 5 days  If worse in the meantime call or recheck  There are no diagnoses linked to this encounter.  F/u prn

## 2023-09-06 ENCOUNTER — Encounter: Payer: Self-pay | Admitting: Medical

## 2023-09-06 NOTE — Progress Notes (Signed)
 Pt called and is wanting note for today for work
